# Patient Record
Sex: Male | Born: 1950 | Race: Black or African American | Hispanic: No | Marital: Single | Smoking: Current every day smoker
Health system: Southern US, Community
[De-identification: ages and names within clinical notes are randomized; demographics above are authoritative.]

## PROBLEM LIST (undated history)

## (undated) DIAGNOSIS — M1990 Unspecified osteoarthritis, unspecified site: Secondary | ICD-10-CM

## (undated) DIAGNOSIS — R413 Other amnesia: Secondary | ICD-10-CM

## (undated) DIAGNOSIS — F329 Major depressive disorder, single episode, unspecified: Secondary | ICD-10-CM

## (undated) DIAGNOSIS — E785 Hyperlipidemia, unspecified: Secondary | ICD-10-CM

## (undated) DIAGNOSIS — I10 Essential (primary) hypertension: Secondary | ICD-10-CM

## (undated) DIAGNOSIS — I639 Cerebral infarction, unspecified: Secondary | ICD-10-CM

## (undated) DIAGNOSIS — F32A Depression, unspecified: Secondary | ICD-10-CM

## (undated) HISTORY — DX: Unspecified osteoarthritis, unspecified site: M19.90

## (undated) HISTORY — DX: Depression, unspecified: F32.A

## (undated) HISTORY — DX: Hyperlipidemia, unspecified: E78.5

## (undated) HISTORY — DX: Essential (primary) hypertension: I10

## (undated) HISTORY — PX: COLONOSCOPY: SHX174

## (undated) HISTORY — DX: Cerebral infarction, unspecified: I63.9

## (undated) HISTORY — DX: Major depressive disorder, single episode, unspecified: F32.9

## (undated) HISTORY — DX: Other amnesia: R41.3

## (undated) HISTORY — PX: LUNG SURGERY: SHX703

---

## 1984-01-25 HISTORY — PX: LIVER SURGERY: SHX698

## 1997-10-26 ENCOUNTER — Emergency Department (HOSPITAL_COMMUNITY): Admission: EM | Admit: 1997-10-26 | Discharge: 1997-10-26 | Payer: Self-pay | Admitting: Emergency Medicine

## 2001-02-15 ENCOUNTER — Emergency Department (HOSPITAL_COMMUNITY): Admission: EM | Admit: 2001-02-15 | Discharge: 2001-02-15 | Payer: Self-pay | Admitting: Emergency Medicine

## 2001-02-15 ENCOUNTER — Encounter: Payer: Self-pay | Admitting: Emergency Medicine

## 2005-10-23 ENCOUNTER — Emergency Department (HOSPITAL_COMMUNITY): Admission: EM | Admit: 2005-10-23 | Discharge: 2005-10-23 | Payer: Self-pay | Admitting: Emergency Medicine

## 2005-12-22 ENCOUNTER — Ambulatory Visit: Payer: Self-pay | Admitting: *Deleted

## 2005-12-22 ENCOUNTER — Ambulatory Visit: Payer: Self-pay | Admitting: Family Medicine

## 2006-10-11 ENCOUNTER — Encounter (INDEPENDENT_AMBULATORY_CARE_PROVIDER_SITE_OTHER): Payer: Self-pay | Admitting: *Deleted

## 2007-04-04 ENCOUNTER — Inpatient Hospital Stay (HOSPITAL_COMMUNITY): Admission: AD | Admit: 2007-04-04 | Discharge: 2007-04-05 | Payer: Self-pay | Admitting: Urology

## 2007-05-28 ENCOUNTER — Ambulatory Visit: Payer: Self-pay | Admitting: Internal Medicine

## 2007-06-19 ENCOUNTER — Ambulatory Visit: Payer: Self-pay | Admitting: Internal Medicine

## 2007-09-21 ENCOUNTER — Ambulatory Visit: Payer: Self-pay | Admitting: Family Medicine

## 2007-09-21 LAB — CONVERTED CEMR LAB
ALT: 30 units/L (ref 0–53)
AST: 30 units/L (ref 0–37)
Albumin: 4 g/dL (ref 3.5–5.2)
Alkaline Phosphatase: 65 units/L (ref 39–117)
BUN: 20 mg/dL (ref 6–23)
Basophils Absolute: 0 10*3/uL (ref 0.0–0.1)
Basophils Relative: 0 % (ref 0–1)
CO2: 21 meq/L (ref 19–32)
Calcium: 9.1 mg/dL (ref 8.4–10.5)
Chloride: 105 meq/L (ref 96–112)
Cholesterol: 174 mg/dL (ref 0–200)
Creatinine, Ser: 1.26 mg/dL (ref 0.40–1.50)
Eosinophils Absolute: 0.2 10*3/uL (ref 0.0–0.7)
Eosinophils Relative: 3 % (ref 0–5)
Glucose, Bld: 87 mg/dL (ref 70–99)
HCT: 46.4 % (ref 39.0–52.0)
HDL: 55 mg/dL (ref >39)
Hemoglobin: 15.7 g/dL (ref 13.0–17.0)
LDL Cholesterol: 89 mg/dL (ref 0–99)
Lymphocytes Relative: 33 % (ref 12–46)
Lymphs Abs: 1.8 10*3/uL (ref 0.7–4.0)
MCHC: 33.8 g/dL (ref 30.0–36.0)
MCV: 90.6 fL (ref 78.0–100.0)
Monocytes Absolute: 0.7 10*3/uL (ref 0.1–1.0)
Monocytes Relative: 13 % — ABNORMAL HIGH (ref 3–12)
Neutro Abs: 2.8 10*3/uL (ref 1.7–7.7)
Neutrophils Relative %: 51 % (ref 43–77)
PSA: 0.98 ng/mL (ref 0.10–4.00)
Platelets: 237 10*3/uL (ref 150–400)
Potassium: 4.6 meq/L (ref 3.5–5.3)
RBC: 5.12 M/uL (ref 4.22–5.81)
RDW: 15.1 % (ref 11.5–15.5)
Sodium: 138 meq/L (ref 135–145)
Total Bilirubin: 0.8 mg/dL (ref 0.3–1.2)
Total CHOL/HDL Ratio: 3.2
Total Protein: 7.4 g/dL (ref 6.0–8.3)
Triglycerides: 151 mg/dL — ABNORMAL HIGH (ref <150)
VLDL: 30 mg/dL (ref 0–40)
WBC: 5.4 10*3/uL (ref 4.0–10.5)

## 2008-01-29 ENCOUNTER — Ambulatory Visit: Payer: Self-pay | Admitting: Family Medicine

## 2008-05-09 ENCOUNTER — Ambulatory Visit: Payer: Self-pay | Admitting: Family Medicine

## 2008-05-09 LAB — CONVERTED CEMR LAB
Chlamydia, Swab/Urine, PCR: NEGATIVE
GC Probe Amp, Urine: NEGATIVE

## 2008-05-12 ENCOUNTER — Ambulatory Visit (HOSPITAL_COMMUNITY): Admission: RE | Admit: 2008-05-12 | Discharge: 2008-05-12 | Payer: Self-pay | Admitting: Family Medicine

## 2008-07-29 ENCOUNTER — Ambulatory Visit: Payer: Self-pay | Admitting: Family Medicine

## 2009-01-24 DIAGNOSIS — I639 Cerebral infarction, unspecified: Secondary | ICD-10-CM

## 2009-01-24 HISTORY — DX: Cerebral infarction, unspecified: I63.9

## 2009-02-11 ENCOUNTER — Telehealth (INDEPENDENT_AMBULATORY_CARE_PROVIDER_SITE_OTHER): Payer: Self-pay | Admitting: *Deleted

## 2009-02-14 ENCOUNTER — Emergency Department (HOSPITAL_COMMUNITY): Admission: EM | Admit: 2009-02-14 | Discharge: 2009-02-14 | Payer: Self-pay | Admitting: Emergency Medicine

## 2009-02-25 ENCOUNTER — Ambulatory Visit: Payer: Self-pay | Admitting: Family Medicine

## 2009-02-25 LAB — CONVERTED CEMR LAB
ALT: 21 units/L (ref 0–53)
AST: 29 units/L (ref 0–37)
Albumin: 4 g/dL (ref 3.5–5.2)
Alkaline Phosphatase: 58 units/L (ref 39–117)
BUN: 9 mg/dL (ref 6–23)
CO2: 21 meq/L (ref 19–32)
Calcium: 8.8 mg/dL (ref 8.4–10.5)
Chloride: 107 meq/L (ref 96–112)
Cholesterol: 196 mg/dL (ref 0–200)
Creatinine, Ser: 0.98 mg/dL (ref 0.40–1.50)
Glucose, Bld: 96 mg/dL (ref 70–99)
HDL: 56 mg/dL (ref >39)
LDL Cholesterol: 128 mg/dL — ABNORMAL HIGH (ref 0–99)
Potassium: 4.4 meq/L (ref 3.5–5.3)
Sodium: 141 meq/L (ref 135–145)
TSH: 1.073 microintl units/mL (ref 0.350–4.500)
Total Bilirubin: 0.5 mg/dL (ref 0.3–1.2)
Total CHOL/HDL Ratio: 3.5
Total Protein: 7.2 g/dL (ref 6.0–8.3)
Triglycerides: 59 mg/dL (ref <150)
VLDL: 12 mg/dL (ref 0–40)

## 2009-02-27 ENCOUNTER — Ambulatory Visit: Payer: Self-pay | Admitting: Family Medicine

## 2009-07-31 ENCOUNTER — Ambulatory Visit: Payer: Self-pay | Admitting: Family Medicine

## 2009-10-02 ENCOUNTER — Ambulatory Visit: Payer: Self-pay | Admitting: Family Medicine

## 2010-01-01 ENCOUNTER — Ambulatory Visit: Payer: Self-pay | Admitting: Family Medicine

## 2010-02-23 NOTE — Progress Notes (Signed)
Summary: triage-slurred speech  Phone Note Call from Patient   Details for Reason: pt calls stating that he is having difficulty getting his words out. Able to speak in full sentences with nurse during assesment. States this has been going on for one day. States is intermittent. He denies any extremity weakness or alteration in gait. He denies headache or other neurological imparements such as facial drop or difficulty swallowing, or difficulty using fine motor skills. consumes 1-2 40 oz per week. States that he has consumed 1 40 oz since the onset of slurred speech. Patient is alert and oriented in NAD. Advised to keep appointment on 02/27/09 for HTN follow up. Scheduled for annual htn labs on 02/25/09 and advised if he NOS for labs his provider appointment will be cancelled. Recommend he decrease his alcohol intake and increase his water consumption. Educated on signs and symptoms of a stroke and advised him to call 911 if these are present, given his high risk behaviors and htn history.  Initial call taken by: Blondell Reveal RN,  February 11, 2009 3:45 PM

## 2010-04-12 LAB — URINALYSIS, ROUTINE W REFLEX MICROSCOPIC
Nitrite: NEGATIVE
Protein, ur: NEGATIVE mg/dL
Urobilinogen, UA: 1 mg/dL (ref 0.0–1.0)

## 2010-04-12 LAB — RAPID URINE DRUG SCREEN, HOSP PERFORMED
Amphetamines: NOT DETECTED
Benzodiazepines: NOT DETECTED
Tetrahydrocannabinol: NOT DETECTED

## 2010-04-12 LAB — COMPREHENSIVE METABOLIC PANEL
Alkaline Phosphatase: 51 U/L (ref 39–117)
BUN: 16 mg/dL (ref 6–23)
CO2: 22 mEq/L (ref 19–32)
Chloride: 107 mEq/L (ref 96–112)
GFR calc non Af Amer: >60 mL/min (ref >60)
Glucose, Bld: 99 mg/dL (ref 70–99)
Potassium: 4 mEq/L (ref 3.5–5.1)
Total Bilirubin: 1.1 mg/dL (ref 0.3–1.2)
Total Protein: 7.1 g/dL (ref 6.0–8.3)

## 2010-04-12 LAB — ETHANOL: Alcohol, Ethyl (B): <5 mg/dL (ref 0–10)

## 2010-04-12 LAB — URINE MICROSCOPIC-ADD ON

## 2010-05-31 IMAGING — CT CT HEAD W/O CM
1 series · 16 of 30 positions shown, 20 images · non-contrast
Comparison: None

CLINICAL DATA: Weakness

CT HEAD WITHOUT CONTRAST
TECHNIQUE: Contiguous axial images were obtained from the base of
the skull through the vertex without contrast.

[Series 2: headseq 4.8 h45s · axial · 0.43mm/px · z∈[-169,-41]mm · 16 of 30 slices shown, 20 images]
[im 2/30  brain]
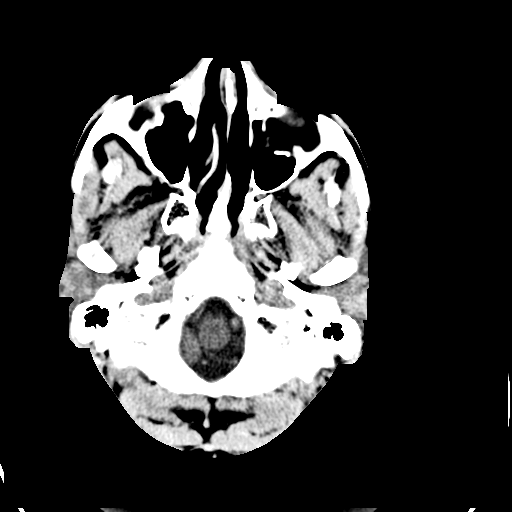
[im 2/30  bone]
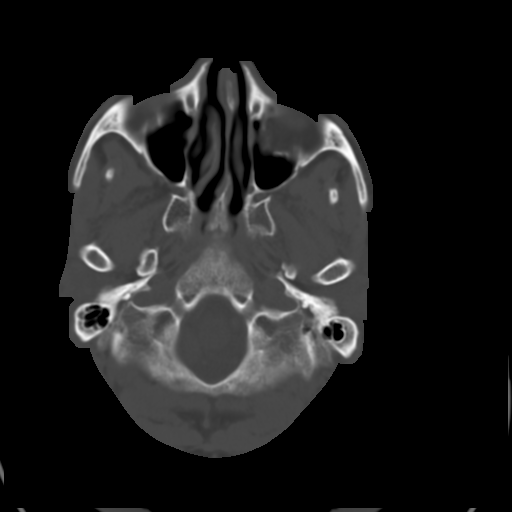
[im 4/30  brain]
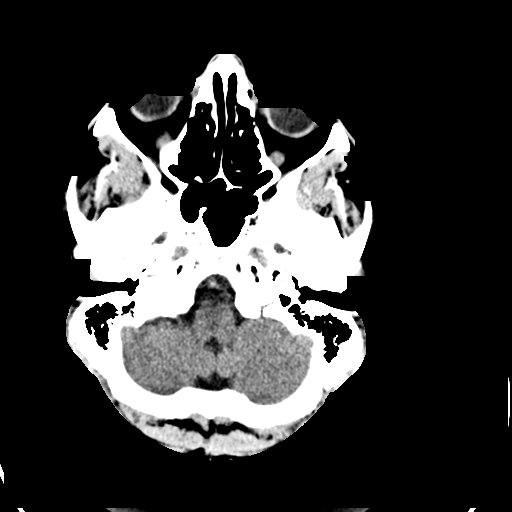
[im 6/30  brain]
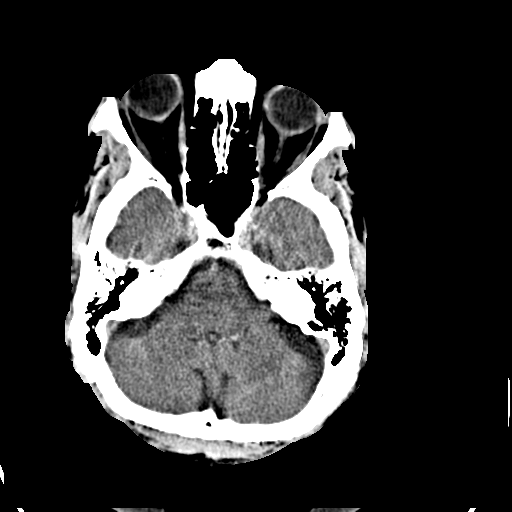
[im 8/30  brain]
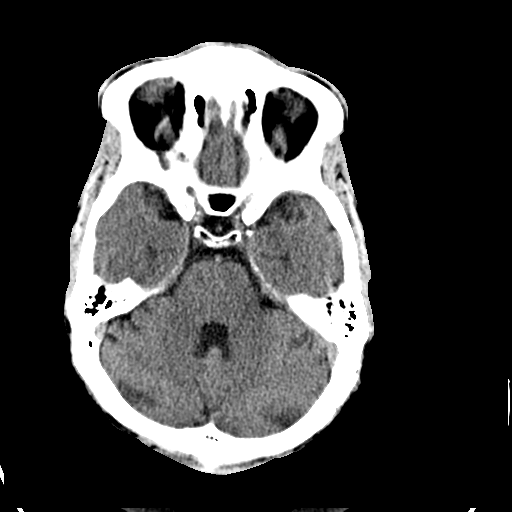
[im 9/30  brain]
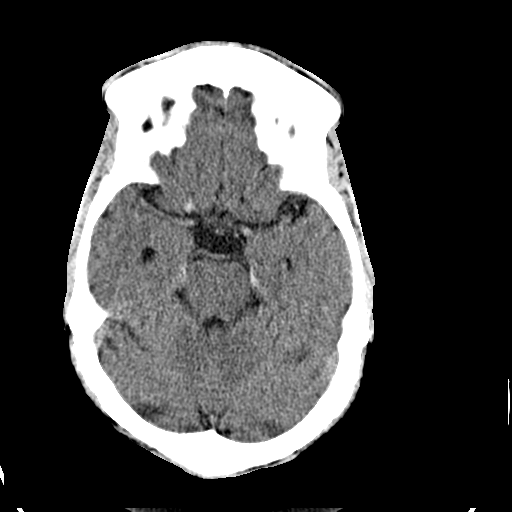
[im 9/30  bone]
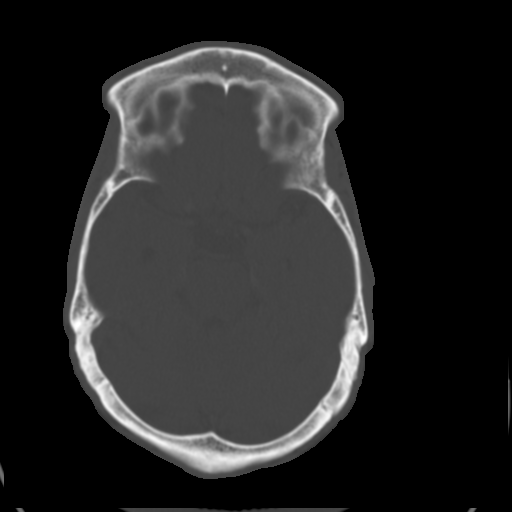
[im 11/30  brain]
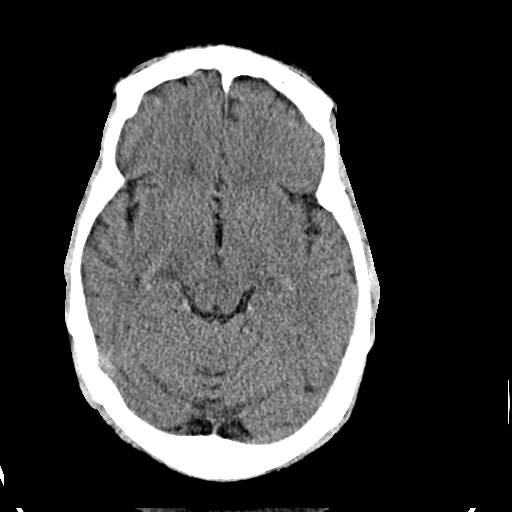
[im 13/30  brain]
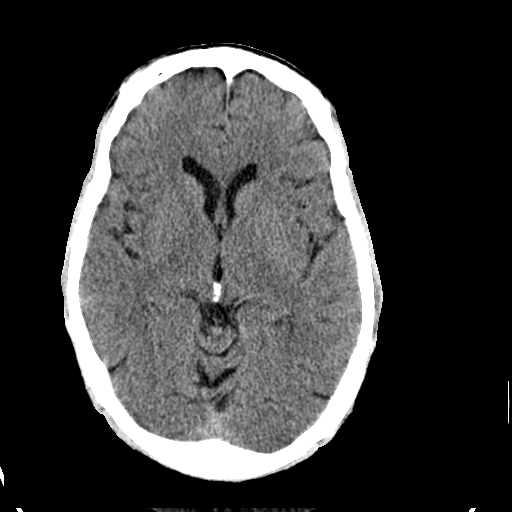
[im 15/30  brain]
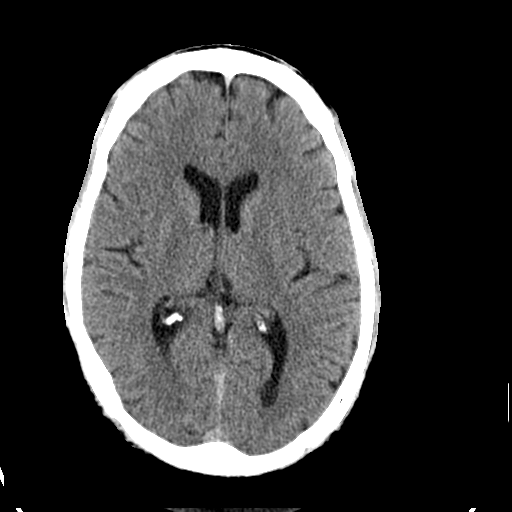
[im 16/30  brain]
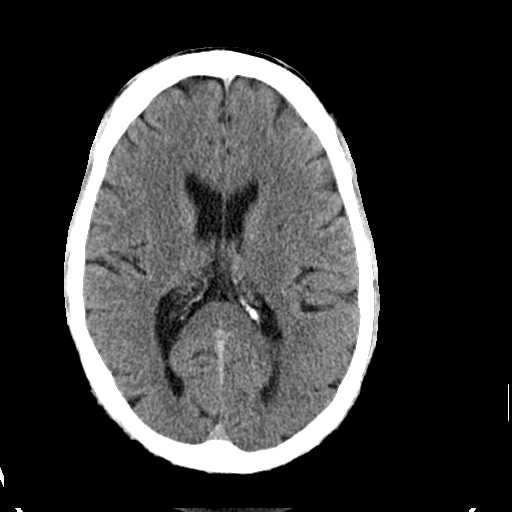
[im 16/30  bone]
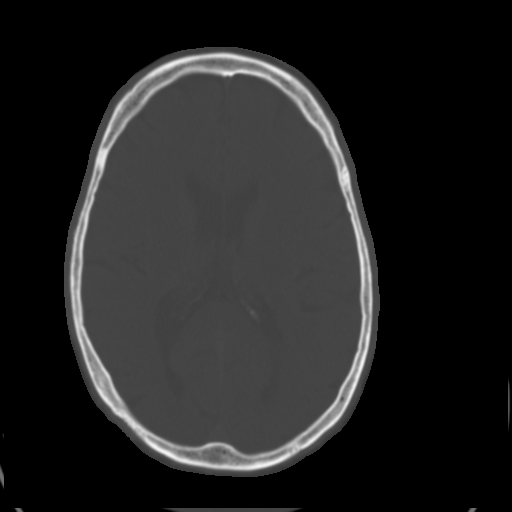
[im 18/30  brain]
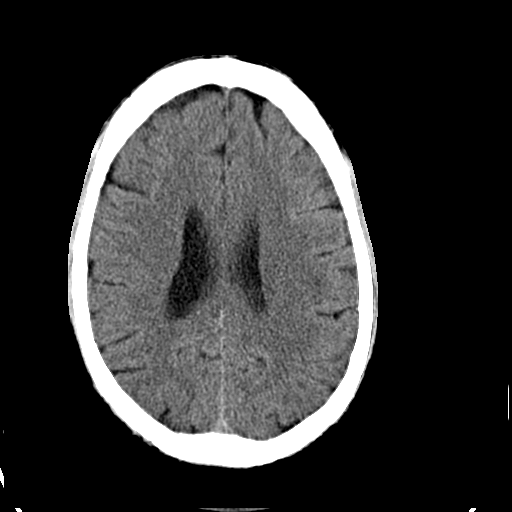
[im 20/30  brain]
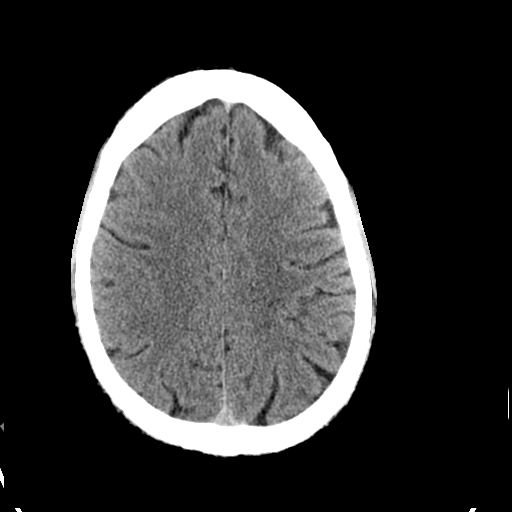
[im 22/30  brain]
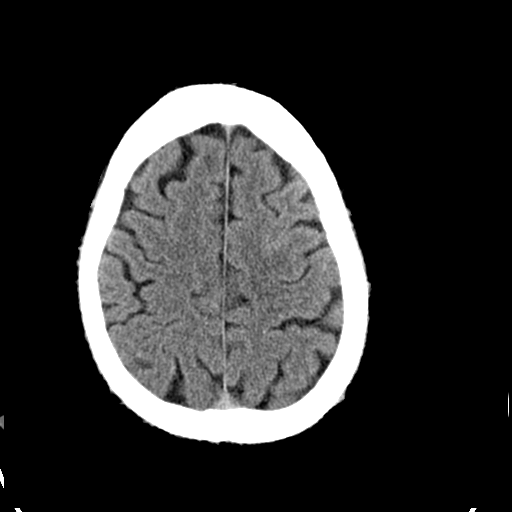
[im 23/30  brain]
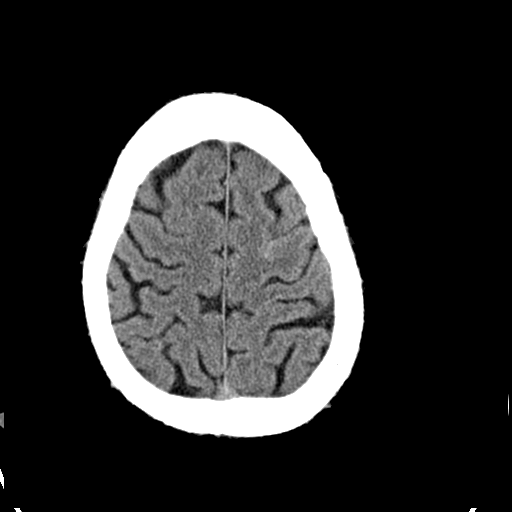
[im 23/30  bone]
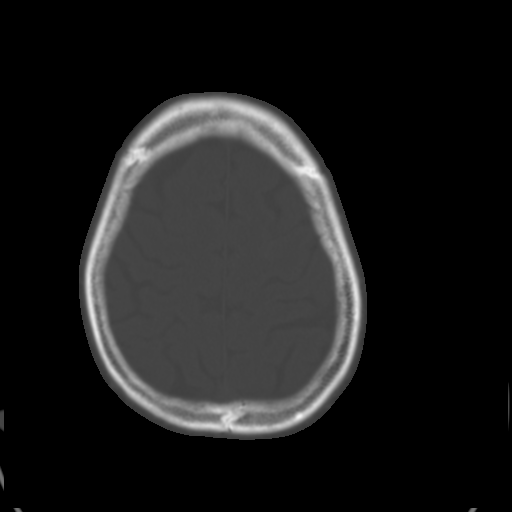
[im 25/30  brain]
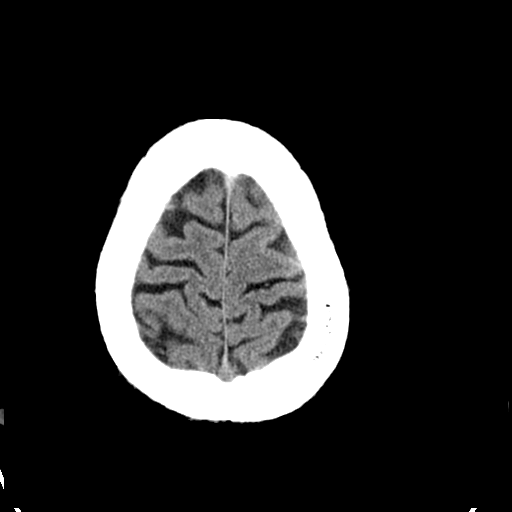
[im 27/30  brain]
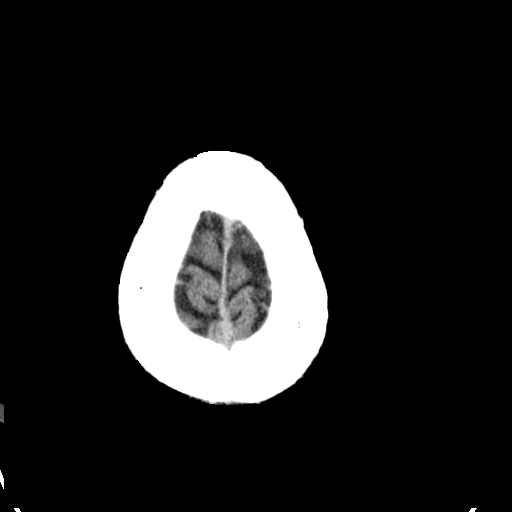
[im 29/30  brain]
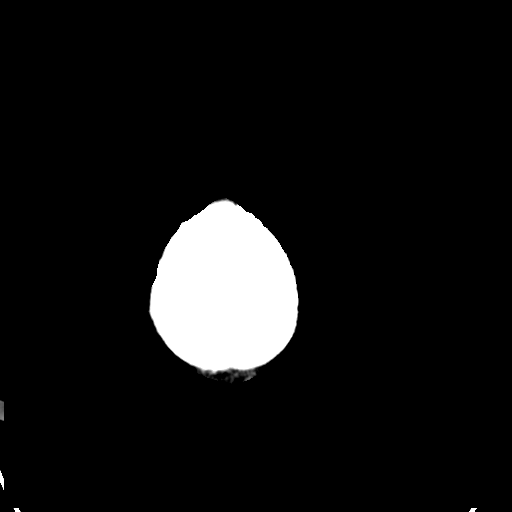

[16 of 30 positions shown; findings below may reference images not displayed]

FINDINGS: No depressed skull fracture is noted.  Paranasal sinuses
and mastoid air cells are unremarkable.  No intracranial
hemorrhage, mass effect or midline shift.  No intra or extra-axial
fluid collection.

No acute infarction.  No mass lesion is noted.

The gray and white matter differentiation is preserved.
IMPRESSION: No acute intracranial abnormality.

## 2010-09-15 ENCOUNTER — Emergency Department (HOSPITAL_COMMUNITY)
Admission: EM | Admit: 2010-09-15 | Discharge: 2010-09-15 | Disposition: A | Discharge status: Home / Self Care | Payer: PRIVATE HEALTH INSURANCE | Attending: Emergency Medicine | Admitting: Emergency Medicine

## 2010-09-15 DIAGNOSIS — L259 Unspecified contact dermatitis, unspecified cause: Secondary | ICD-10-CM | POA: Insufficient documentation

## 2010-09-15 DIAGNOSIS — M129 Arthropathy, unspecified: Secondary | ICD-10-CM | POA: Insufficient documentation

## 2010-09-15 DIAGNOSIS — N529 Male erectile dysfunction, unspecified: Secondary | ICD-10-CM | POA: Insufficient documentation

## 2010-09-15 DIAGNOSIS — I10 Essential (primary) hypertension: Secondary | ICD-10-CM | POA: Insufficient documentation

## 2011-01-13 DIAGNOSIS — F1011 Alcohol abuse, in remission: Secondary | ICD-10-CM | POA: Insufficient documentation

## 2011-01-31 DIAGNOSIS — F32A Depression, unspecified: Secondary | ICD-10-CM | POA: Insufficient documentation

## 2011-02-08 ENCOUNTER — Encounter: Payer: Self-pay | Admitting: Internal Medicine

## 2011-03-02 ENCOUNTER — Ambulatory Visit (AMBULATORY_SURGERY_CENTER): Payer: PRIVATE HEALTH INSURANCE | Admitting: *Deleted

## 2011-03-02 VITALS — Ht 66.0 in | Wt 151.9 lb

## 2011-03-02 DIAGNOSIS — Z1211 Encounter for screening for malignant neoplasm of colon: Secondary | ICD-10-CM

## 2011-03-02 MED ORDER — MOVIPREP 100 G PO SOLR: ORAL | Status: DC

## 2011-03-16 ENCOUNTER — Ambulatory Visit (AMBULATORY_SURGERY_CENTER): Payer: Medicaid Other | Admitting: Internal Medicine

## 2011-03-16 ENCOUNTER — Encounter: Payer: Self-pay | Admitting: Internal Medicine

## 2011-03-16 VITALS — BP 136/99 | HR 77 | Temp 98.1°F | Resp 12 | Ht 66.0 in | Wt 151.0 lb

## 2011-03-16 DIAGNOSIS — D126 Benign neoplasm of colon, unspecified: Secondary | ICD-10-CM

## 2011-03-16 DIAGNOSIS — Z86010 Personal history of colonic polyps: Secondary | ICD-10-CM | POA: Insufficient documentation

## 2011-03-16 DIAGNOSIS — Z1211 Encounter for screening for malignant neoplasm of colon: Secondary | ICD-10-CM

## 2011-03-16 DIAGNOSIS — K5730 Diverticulosis of large intestine without perforation or abscess without bleeding: Secondary | ICD-10-CM

## 2011-03-16 MED ORDER — NAPROXEN 250 MG PO TABS: 250.0000 mg | ORAL_TABLET | Freq: Two times a day (BID) | ORAL | Status: AC

## 2011-03-16 MED ORDER — SODIUM CHLORIDE 0.9 % IV SOLN: 500.0000 mL | INTRAVENOUS | Status: DC

## 2011-03-16 MED ORDER — ASPIRIN EC 81 MG PO TBEC: 81.0000 mg | DELAYED_RELEASE_TABLET | Freq: Every day | ORAL | Status: AC

## 2011-03-16 NOTE — Op Note (Signed)
[facility] [address] [place], [place]  [address]  COLONOSCOPY PROCEDURE REPORT  PATIENT:  Christian, Sanford  MR#:  [id number] BIRTHDATE:  1951-01-15, 60 yrs. old  GENDER:  male ENDOSCOPIST:  Iva Boop, MD, [facility] REF. BY:  Tracey Harries, M.D. PROCEDURE DATE:  03/16/2011 PROCEDURE:  Colonoscopy with snare polypectomy ASA CLASS:  Class II INDICATIONS:  Routine Risk Screening MEDICATIONS:   These medications were titrated to patient response per physician's verbal order, Fentanyl 75 mcg IV, Versed 5 mg IV, Benadryl 12.5 mg IV  DESCRIPTION OF PROCEDURE:   After the risks benefits and alternatives of the procedure were thoroughly explained, informed consent was obtained.  Digital rectal exam was performed and revealed no abnormalities.   The [id number] endoscope was introduced through the anus and advanced to the cecum, which was identified by both the appendix and ileocecal valve, without limitations.  The quality of the prep was good, using MoviPrep. The instrument was then slowly withdrawn as the colon was fully examined. <<PROCEDUREIMAGES>>  FINDINGS:  Three polyps were found. Ascending colon (1cm - snare cautery), descedning colon (5 mm cold snare), sigmoid (5 mm - cold snare). All removed sent to pathology.  Moderate diverticulosis was found in the left colon.  This was otherwise a normal examination of the colon.   Retroflexed views in the rectum revealed no abnormalities.    The time to cecum = 2:08 minutes. The scope was then withdrawn in 22:24 minutes from the cecum and the procedure completed. COMPLICATIONS:  None ENDOSCOPIC IMPRESSION: 1) Three polyps removed, largest 1 cm 2) Moderate diverticulosis in the left colon 3) Otherwise normal examination RECOMMENDATIONS: 1) Hold aspirin, aspirin products, and anti-inflammatory medication for 2 weeks. REPEAT EXAM:  In for Colonoscopy, pending biopsy results.  Iva Boop, MD, [facility]  CC:  Tracey Harries, MD and The Patient  n. eSIGNED:   Iva Boop at 03/16/2011 10:02 AM  Artemio Aly, [phone]

## 2011-03-16 NOTE — Progress Notes (Signed)
@   4540@ DISCHARGE Patient did not experience any of the following events: a burn prior to discharge; a fall within the facility; wrong site/side/patient/procedure/implant event; or a hospital transfer or hospital admission upon discharge from the facility. [phone]) Patient did not have preoperative order for IV antibiotic SSI prophylaxis. [phone])

## 2011-03-16 NOTE — Patient Instructions (Addendum)
Three polyps were removed today. They look benign. Iva Boop, MD, FACG   YOU HAD AN ENDOSCOPIC PROCEDURE TODAY AT THE [facility]: Refer to the procedure report that was given to you for any specific questions about what was found during the examination.  If the procedure report does not answer your questions, please call your gastroenterologist to clarify.  If you requested that your care partner not be given the details of your procedure findings, then the procedure report has been included in a sealed envelope for you to review at your convenience later.  YOU SHOULD EXPECT: Some feelings of bloating in the abdomen. Passage of more gas than usual.  Walking can help get rid of the air that was put into your GI tract during the procedure and reduce the bloating. If you had a lower endoscopy (such as a colonoscopy or flexible sigmoidoscopy) you may notice spotting of blood in your stool or on the toilet paper. If you underwent a bowel prep for your procedure, then you may not have a normal bowel movement for a few days.  DIET: Your first meal following the procedure should be a light meal and then it is ok to progress to your normal diet.  A half-sandwich or bowl of soup is an example of a good first meal.  Heavy or fried foods are harder to digest and may make you feel nauseous or bloated.  Likewise meals heavy in dairy and vegetables can cause extra gas to form and this can also increase the bloating.  Drink plenty of fluids but you should avoid alcoholic beverages for 24 hours.  ACTIVITY: Your care partner should take you home directly after the procedure.  You should plan to take it easy, moving slowly for the rest of the day.  You can resume normal activity the day after the procedure however you should NOT DRIVE or use heavy machinery for 24 hours (because of the sedation medicines used during the test).    SYMPTOMS TO REPORT IMMEDIATELY: A gastroenterologist can be reached at  any hour.  During normal business hours, 8:30 AM to 5:00 PM Monday through Friday, call [phone].  After hours and on weekends, please call the GI answering service at [phone] who will take a message and have the physician on call contact you.   Following lower endoscopy (colonoscopy or flexible sigmoidoscopy):  Excessive amounts of blood in the stool  Significant tenderness or worsening of abdominal pains  Swelling of the abdomen that is new, acute  Fever of 100F or higher  Following upper endoscopy (EGD)  Vomiting of blood or coffee ground material  New chest pain or pain under the shoulder blades  Painful or persistently difficult swallowing  New shortness of breath  Fever of 100F or higher  Black, tarry-looking stools  FOLLOW UP: If any biopsies were taken you will be contacted by phone or by letter within the next 1-3 weeks.  Call your gastroenterologist if you have not heard about the biopsies in 3 weeks.  Our staff will call the home number listed on your records the next business day following your procedure to check on you and address any questions or concerns that you may have at that time regarding the information given to you following your procedure. This is a courtesy call and so if there is no answer at the home number and we have not heard from you through the emergency physician on call, we will assume that you  have returned to your regular daily activities without incident.  SIGNATURES/CONFIDENTIALITY: You and/or your care partner have signed paperwork which will be entered into your electronic medical record.  These signatures attest to the fact that that the information above on your After Visit Summary has been reviewed and is understood.  Full responsibility of the confidentiality of this discharge information lies with you and/or your care-partner.    Colon Polyps A polyp is extra tissue that grows inside your body. Colon polyps grow in the large  intestine. The large intestine, also called the colon, is part of your digestive system. It is a long, hollow tube at the end of your digestive tract where your body makes and stores stool. Most polyps are not dangerous. They are benign. This means they are not cancerous. But over time, some types of polyps can turn into cancer. Polyps that are smaller than a pea are usually not harmful. But larger polyps could someday become or may already be cancerous. To be safe, doctors remove all polyps and test them.  WHO GETS POLYPS? Anyone can get polyps, but certain people are more likely than others. You may have a greater chance of getting polyps if:  You are over 50.   You have had polyps before.   Someone in your family has had polyps.   Someone in your family has had cancer of the large intestine.   Find out if someone in your family has had polyps. You may also be more likely to get polyps if you:   Eat a lot of fatty foods.   Smoke.   Drink alcohol.   Do not exercise.   Eat too much.  SYMPTOMS  Most small polyps do not cause symptoms. People often do not know they have one until their caregiver finds it during a regular checkup or while testing them for something else. Some people do have symptoms like these:  Bleeding from the anus. You might notice blood on your underwear or on toilet paper after you have had a bowel movement.   Constipation or diarrhea that lasts more than a week.   Blood in the stool. Blood can make stool look black or it can show up as red streaks in the stool.  If you have any of these symptoms, see your caregiver. HOW DOES THE DOCTOR TEST FOR POLYPS? The doctor can use four tests to check for polyps:  Digital rectal exam. The caregiver wears gloves and checks your rectum (the last part of the large intestine) to see if it feels normal. This test would find polyps only in the rectum. Your caregiver may need to do one of the other tests listed below to find  polyps higher up in the intestine.   Barium enema. The caregiver puts a liquid called barium into your rectum before taking x-rays of your large intestine. Barium makes your intestine look white in the pictures. Polyps are dark, so they are easy to see.   Sigmoidoscopy. With this test, the caregiver can see inside your large intestine. A thin flexible tube is placed into your rectum. The device is called a sigmoidoscope, which has a light and a tiny video camera in it. The caregiver uses the sigmoidoscope to look at the last third of your large intestine.   Colonoscopy. This test is like sigmoidoscopy, but the caregiver looks at all of the large intestine. It usually requires sedation. This is the most common method for finding and removing polyps.  TREATMENT  The caregiver will remove the polyp during sigmoidoscopy or colonoscopy. The polyp is then tested for cancer.   If you have had polyps, your caregiver may want you to get tested regularly in the future.  PREVENTION  There is not one sure way to prevent polyps. You might be able to lower your risk of getting them if you:  Eat more fruits and vegetables and less fatty food.   Do not smoke.   Avoid alcohol.   Exercise every day.   Lose weight if you are overweight.   Eating more calcium and folate can also lower your risk of getting polyps. Some foods that are rich in calcium are milk, cheese, and broccoli. Some foods that are rich in folate are chickpeas, kidney beans, and spinach.   Aspirin might help prevent polyps. Studies are under way.  Document Released: 10/07/2003 Document Revised: 09/22/2010 Document Reviewed: 03/14/2007 [facility] Patient Information 2012 [place], [place].  Diverticulosis Diverticulosis is a common condition that develops when small pouches (diverticula) form in the wall of the colon. The risk of diverticulosis increases with age. It happens more often in people who eat a low-fiber diet. Most individuals  with diverticulosis have no symptoms. Those individuals with symptoms usually experience abdominal pain, constipation, or loose stools (diarrhea). HOME CARE INSTRUCTIONS   Increase the amount of fiber in your diet as directed by your caregiver or dietician. This may reduce symptoms of diverticulosis.   Your caregiver may recommend taking a dietary fiber supplement.   Drink at least 6 to 8 glasses of water each day to prevent constipation.   Try not to strain when you have a bowel movement.   Your caregiver may recommend avoiding nuts and seeds to prevent complications, although this is still an uncertain benefit.   Only take over-the-counter or prescription medicines for pain, discomfort, or fever as directed by your caregiver.  FOODS WITH HIGH FIBER CONTENT INCLUDE:  Fruits. Apple, peach, pear, tangerine, raisins, prunes.   Vegetables. Brussels sprouts, asparagus, broccoli, cabbage, carrot, cauliflower, romaine lettuce, spinach, summer squash, tomato, winter squash, zucchini.   Starchy Vegetables. Baked beans, kidney beans, lima beans, split peas, lentils, potatoes (with skin).   Grains. Whole wheat bread, brown rice, bran flake cereal, plain oatmeal, white rice, shredded wheat, bran muffins.  SEEK IMMEDIATE MEDICAL CARE IF:   You develop increasing pain or severe bloating.   You have an oral temperature above 102 F (38.9 C), not controlled by medicine.   You develop vomiting or bowel movements that are bloody or black.  Document Released: 10/08/2003 Document Revised: 09/22/2010 Document Reviewed: 06/10/2009 [facility] Patient Information 2012 [place], [place].

## 2011-03-17 ENCOUNTER — Telehealth: Payer: Self-pay | Admitting: *Deleted

## 2011-03-17 NOTE — Telephone Encounter (Signed)
  Follow up Call-  Call back number 03/16/2011  Post procedure Call Back phone  # [phone]  Permission to leave phone message Yes     Patient questions:  Do you have a fever, pain , or abdominal swelling? no Pain Score  0 *  Have you tolerated food without any problems? yes  Have you been able to return to your normal activities? yes  Do you have any questions about your discharge instructions: Diet   no Medications  no Follow up visit  no  Do you have questions or concerns about your Care? no  Actions: * If pain score is 4 or above: No action needed, pain <4.

## 2011-03-21 ENCOUNTER — Encounter: Payer: Self-pay | Admitting: Internal Medicine

## 2011-03-21 NOTE — Progress Notes (Signed)
Quick Note:  3 adenomas with largest 1 cm Repeat colonoscopy 3 yrs - 02/2013 ______

## 2013-04-11 DIAGNOSIS — M549 Dorsalgia, unspecified: Secondary | ICD-10-CM | POA: Insufficient documentation

## 2013-04-11 DIAGNOSIS — R413 Other amnesia: Secondary | ICD-10-CM | POA: Insufficient documentation

## 2013-04-11 DIAGNOSIS — N529 Male erectile dysfunction, unspecified: Secondary | ICD-10-CM | POA: Insufficient documentation

## 2013-04-11 DIAGNOSIS — I10 Essential (primary) hypertension: Secondary | ICD-10-CM | POA: Insufficient documentation

## 2013-04-11 DIAGNOSIS — B1920 Unspecified viral hepatitis C without hepatic coma: Secondary | ICD-10-CM | POA: Insufficient documentation

## 2013-04-11 DIAGNOSIS — J45909 Unspecified asthma, uncomplicated: Secondary | ICD-10-CM | POA: Insufficient documentation

## 2013-04-11 DIAGNOSIS — E7800 Pure hypercholesterolemia, unspecified: Secondary | ICD-10-CM | POA: Insufficient documentation

## 2013-04-11 DIAGNOSIS — F17200 Nicotine dependence, unspecified, uncomplicated: Secondary | ICD-10-CM | POA: Insufficient documentation

## 2013-08-24 ENCOUNTER — Encounter: Payer: Self-pay | Admitting: *Deleted

## 2014-03-03 ENCOUNTER — Encounter: Payer: Self-pay | Admitting: Internal Medicine

## 2014-03-12 ENCOUNTER — Encounter: Payer: Self-pay | Admitting: Internal Medicine

## 2014-03-18 ENCOUNTER — Encounter: Payer: Self-pay | Admitting: Internal Medicine

## 2014-05-13 ENCOUNTER — Ambulatory Visit (AMBULATORY_SURGERY_CENTER): Payer: Self-pay

## 2014-05-13 VITALS — Ht 66.0 in | Wt 170.0 lb

## 2014-05-13 DIAGNOSIS — Z86010 Personal history of colonic polyps: Secondary | ICD-10-CM

## 2014-05-13 NOTE — Progress Notes (Signed)
Per pt, no allergies to soy or egg products.Pt not taking any weight loss meds or using  O2 at home. 

## 2014-05-27 ENCOUNTER — Encounter: Payer: Self-pay | Admitting: Internal Medicine

## 2014-05-27 ENCOUNTER — Ambulatory Visit (AMBULATORY_SURGERY_CENTER): Payer: Medicare Other | Admitting: Internal Medicine

## 2014-05-27 VITALS — BP 124/82 | HR 76 | Temp 98.2°F | Resp 12 | Ht 66.0 in | Wt 170.0 lb

## 2014-05-27 DIAGNOSIS — Z86010 Personal history of colonic polyps: Secondary | ICD-10-CM

## 2014-05-27 MED ORDER — SODIUM CHLORIDE 0.9 % IV SOLN: 500.0000 mL | INTRAVENOUS | Status: DC

## 2014-05-27 NOTE — Progress Notes (Signed)
A/ox3 pleased with MAC, report to Annette RN 

## 2014-05-27 NOTE — Patient Instructions (Addendum)
No polyps today! You still have a condition called diverticulosis - common and not usually a problem. Please read the handout provided.  Your next routine colonoscopy should be in 5 years - 2021.  I appreciate the opportunity to care for you. Gatha Mayer, MD, FACG      YOU HAD AN ENDOSCOPIC PROCEDURE TODAY AT [facility]:   Refer to the procedure report that was given to you for any specific questions about what was found during the examination.  If the procedure report does not answer your questions, please call your gastroenterologist to clarify.  If you requested that your care partner not be given the details of your procedure findings, then the procedure report has been included in a sealed envelope for you to review at your convenience later.  YOU SHOULD EXPECT: Some feelings of bloating in the abdomen. Passage of more gas than usual.  Walking can help get rid of the air that was put into your GI tract during the procedure and reduce the bloating. If you had a lower endoscopy (such as a colonoscopy or flexible sigmoidoscopy) you may notice spotting of blood in your stool or on the toilet paper. If you underwent a bowel prep for your procedure, you may not have a normal bowel movement for a few days.  Please Note:  You might notice some irritation and congestion in your nose or some drainage.  This is from the oxygen used during your procedure.  There is no need for concern and it should clear up in a day or so.  SYMPTOMS TO REPORT IMMEDIATELY:   Following lower endoscopy (colonoscopy or flexible sigmoidoscopy):  Excessive amounts of blood in the stool  Significant tenderness or worsening of abdominal pains  Swelling of the abdomen that is new, acute  Fever of 100F or higher   For urgent or emergent issues, a gastroenterologist can be reached at any hour by calling [phone].   DIET: Your first meal following the procedure should be a small meal  and then it is ok to progress to your normal diet. Heavy or fried foods are harder to digest and may make you feel nauseous or bloated.  Likewise, meals heavy in dairy and vegetables can increase bloating.  Drink plenty of fluids but you should avoid alcoholic beverages for 24 hours.  ACTIVITY:  You should plan to take it easy for the rest of today and you should NOT DRIVE or use heavy machinery until tomorrow (because of the sedation medicines used during the test).    FOLLOW UP: Our staff will call the number listed on your records the next business day following your procedure to check on you and address any questions or concerns that you may have regarding the information given to you following your procedure. If we do not reach you, we will leave a message.  However, if you are feeling well and you are not experiencing any problems, there is no need to return our call.  We will assume that you have returned to your regular daily activities without incident.  If any biopsies were taken you will be contacted by phone or by letter within the next 1-3 weeks.  Please call us at [phone] if you have not heard about the biopsies in 3 weeks.    SIGNATURES/CONFIDENTIALITY: You and/or your care partner have signed paperwork which will be entered into your electronic medical record.  These signatures attest to the fact that that the information  above on your After Visit Summary has been reviewed and is understood.  Full responsibility of the confidentiality of this discharge information lies with you and/or your care-partner.     Handout was given to your care partner on diverticulosis.  You may resume your current medications today. Please call if any questions or concerns.

## 2014-05-27 NOTE — Op Note (Signed)
[place]  [address] [place], [address]   COLONOSCOPY PROCEDURE REPORT  PATIENT: Christian, Sanford  MR#: [id number] BIRTHDATE: 05-17-1950 , 45  yrs. old GENDER: male ENDOSCOPIST: Gatha Mayer, MD, [facility] PROCEDURE DATE:  05/27/2014 PROCEDURE:   Colonoscopy, surveillance First Screening Colonoscopy - Avg.  risk and is 50 yrs.  old or older - No.  Prior Negative Screening - Now for repeat screening. N/A  History of Adenoma - Now for follow-up colonoscopy & has been > or = to 3 yrs.  Yes hx of adenoma.  Has been 3 or more years since last colonoscopy. ASA CLASS:   Class II INDICATIONS:Surveillance due to prior colonic neoplasia and PH Colon Adenoma. MEDICATIONS: Propofol 180 mg IV and Monitored anesthesia care  DESCRIPTION OF PROCEDURE:   After the risks benefits and alternatives of the procedure were thoroughly explained, informed consent was obtained.  The digital rectal exam revealed no abnormalities of the rectum, revealed no prostatic nodules, and revealed the prostate was not enlarged.   The LB [id number] endoscope was introduced through the anus and advanced to the cecum, which was identified by both the appendix and ileocecal valve. No adverse events experienced.   The quality of the prep was good.  (MiraLax was used)  The instrument was then slowly withdrawn as the colon was fully examined.      COLON FINDINGS: There was diverticulosis noted throughout the entire examined colon.   The examination was otherwise normal. Retroflexed views revealed no abnormalities. The time to cecum = 2.0 Withdrawal time = 11.4   The scope was withdrawn and the procedure completed. COMPLICATIONS: There were no immediate complications.  ENDOSCOPIC IMPRESSION: 1.   Diverticulosis was noted throughout the entire examined colon 2.   The examination was otherwise normal - good prep  RECOMMENDATIONS: Repeat Colonoscopy in 5 years. Hx 3 adenomas max 10 mm  2013  eSigned:  Gatha Mayer, MD, [facility] 05/27/2014 10:09 AM   cc: Eldridge Abrahams, NP and The Patient

## 2014-05-27 NOTE — Progress Notes (Signed)
No problems noted in the recovery room. maw 

## 2014-05-28 ENCOUNTER — Telehealth: Payer: Self-pay | Admitting: *Deleted

## 2014-05-28 NOTE — Telephone Encounter (Signed)
  Follow up Call-  Call back number 05/27/2014  Post procedure Call Back phone  # [phone]  Permission to leave phone message Yes     No answer and no answering machine

## 2014-10-14 ENCOUNTER — Emergency Department (HOSPITAL_COMMUNITY)
Admission: EM | Admit: 2014-10-14 | Discharge: 2014-10-14 | Disposition: A | Discharge status: Home / Self Care | Payer: Medicare HMO | Attending: Emergency Medicine | Admitting: Emergency Medicine

## 2014-10-14 ENCOUNTER — Encounter (HOSPITAL_COMMUNITY): Payer: Self-pay | Admitting: Emergency Medicine

## 2014-10-14 DIAGNOSIS — I10 Essential (primary) hypertension: Secondary | ICD-10-CM | POA: Insufficient documentation

## 2014-10-14 DIAGNOSIS — M19042 Primary osteoarthritis, left hand: Secondary | ICD-10-CM | POA: Diagnosis not present

## 2014-10-14 DIAGNOSIS — E785 Hyperlipidemia, unspecified: Secondary | ICD-10-CM | POA: Diagnosis not present

## 2014-10-14 DIAGNOSIS — F329 Major depressive disorder, single episode, unspecified: Secondary | ICD-10-CM | POA: Diagnosis not present

## 2014-10-14 DIAGNOSIS — L290 Pruritus ani: Secondary | ICD-10-CM

## 2014-10-14 DIAGNOSIS — Z8673 Personal history of transient ischemic attack (TIA), and cerebral infarction without residual deficits: Secondary | ICD-10-CM | POA: Insufficient documentation

## 2014-10-14 DIAGNOSIS — Z720 Tobacco use: Secondary | ICD-10-CM | POA: Insufficient documentation

## 2014-10-14 DIAGNOSIS — K649 Unspecified hemorrhoids: Secondary | ICD-10-CM | POA: Diagnosis not present

## 2014-10-14 DIAGNOSIS — Z79899 Other long term (current) drug therapy: Secondary | ICD-10-CM | POA: Diagnosis not present

## 2014-10-14 DIAGNOSIS — Z7982 Long term (current) use of aspirin: Secondary | ICD-10-CM | POA: Insufficient documentation

## 2014-10-14 DIAGNOSIS — K6289 Other specified diseases of anus and rectum: Secondary | ICD-10-CM | POA: Diagnosis present

## 2014-10-14 MED ORDER — HYDROCORTISONE ACETATE 25 MG RE SUPP: 25.0000 mg | Freq: Two times a day (BID) | RECTAL | Status: AC

## 2014-10-14 NOTE — ED Notes (Signed)
2 week hx of rectal itching. Denies blood in stools. Used Aspercreme for intermittent treatment with minimal relief of itching
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
## 2014-10-14 NOTE — Discharge Instructions (Signed)
Use anusol as directed. Avoid becoming constipating by eating plenty of fiber and drinking lots of water. Perform warm sitz baths 4 times daily to help with rectal itching/hemorrhoids. Follow up with your regular doctor in 1 week for recheck of symptoms. Return to the ER for changes or worsening symptoms.   Hemorrhoids Hemorrhoids are puffy (swollen) veins around the rectum or anus. Hemorrhoids can cause pain, itching, bleeding, or irritation. HOME CARE 1. Eat foods with fiber, such as whole grains, beans, nuts, fruits, and vegetables. Ask your doctor about taking products with added fiber in them (fibersupplements). 2. Drink enough fluid to keep your pee (urine) clear or pale yellow. 3. Exercise often. 4. Go to the bathroom when you have the urge to poop. Do not wait. 5. Avoid straining to poop (bowel movement). 6. Keep the butt area dry and clean. Use wet toilet paper or moist paper towels. 7. Medicated creams and medicine inserted into the anus (anal suppository) may be used or applied as told. 8. Only take medicine as told by your doctor. 9. Take a warm water bath (sitz bath) for 15-20 minutes to ease pain. Do this 3-4 times a day. 10. Place ice packs on the area if it is tender or puffy. Use the ice packs between the warm water baths. 1. Put ice in a plastic bag. 2. Place a towel between your skin and the bag. 3. Leave the ice on for 15-20 minutes, 03-04 times a day. 11. Do not use a donut-shaped pillow or sit on the toilet for a long time. GET HELP RIGHT AWAY IF:   You have more pain that is not controlled by treatment or medicine.  You have bleeding that will not stop.  You have trouble or are unable to poop (bowel movement).  You have pain or puffiness outside the area of the hemorrhoids. MAKE SURE YOU:   Understand these instructions.  Will watch your condition.  Will get help right away if you are not doing well or get worse. Document Released: 10/20/2007 Document  Revised: 12/28/2011 Document Reviewed: 11/22/2011 [facility] Patient Information 2015 [place], [place]. This information is not intended to replace advice given to you by your health care provider. Make sure you discuss any questions you have with your health care provider.  Anal Pruritus Anal pruritus is when the area around the opening of the butt (anus) itches. This itching often happens when the area becomes moist. Moisture may be caused by sweating or a small amount of poop (stool) left on the area. Scratching can cause more skin damage. HOME CARE 12. Keep clean by washing your body well. 13. Clean the affected area with wet toilet paper, baby wipes, or a wet washcloth. Do this after you poop and at bedtime. Avoid using soaps on this area. Dry the area well. Pat the area dry. 14. Do not scrub the area with anything, even toilet paper. 15. Do not scratch the itchy area. Scratching makes the itching worse. 16. Take a warm water bath (sitz bath). Do this for 15 to 20 minutes, 2 to 3 times a day. Pat the area dry. 17. Use zinc oxide cream or a cream that keeps moisture away on the area. 18. Only take medicines as told by your doctor. 37. Talk to your doctor about fiber pills (supplements). 20. Wear cotton underwear and loose clothing. 21. Do not use bubble bath, scented toilet paper, or genital deodorants. GET HELP RIGHT AWAY IF:  Your itching does not get better or  it gets worse.  You have a fever.  You have more pain, puffiness (swelling), or redness. MAKE SURE YOU:   Understand these instructions.  Will watch your condition.  Will get help right away if you are not doing well or get worse. Document Released: 09/22/2010 Document Revised: 04/04/2011 Document Reviewed: 09/22/2010 [facility] Patient Information 2015 [place], [place]. This information is not intended to replace advice given to you by your health care provider. Make sure you discuss any questions you have with your health care  provider.  Disposable Sitz Bath A disposable sitz bath is a plastic basin that fits over the toilet. A bag is hung above the toilet and is connected to a tube that opens into the disposable sitz bath. The bag is filled with warm water that can flow into the basin through the tube.  HOW TO USE A DISPOSABLE SITZ BATH 22. Close the clamp on the tubing before filling the bag with water. This is to prevent leakage. 23. Fill the sitz bath basin and the plastic bag with warm water. 24. Place the filled basin on the toilet with the seat raised. Make sure the overflow opening is facing toward the back of the toilet. Central the filled plastic bag overhead on a hook or towel rack close to the toilet. When the bag is unclamped, a steady stream of water will flow from the bag, through the tubing, and into the basin. 26. Attach the tubing to the opening on the basin. 27. Sit on the basin positioned on the toilet seat and release the clamp. This will allow warm water to flush the area around your genitals and anus (perineum). 28. Remain sitting on the basin for approximately 15 to 20 minutes. 29. Stand up and pat the perineum area dry. If needed, apply clean bandages (dressings) to the affected area. 30. Tip the basin into the toilet to remove any remaining water and flush the toilet. 31. Wash the basin with warm water and soap. Let it dry in the sink. 32. Store the basin and tubing in a clean, dry area. 39. Wash your hands with soap and water. SEEK MEDICAL CARE IF: You get worse instead of better. Stop the sitz baths if you get worse. MAKE SURE YOU:  Understand these instructions.  Will watch your condition.  Will get help right away if you are not doing well or get worse. Document Released: 07/12/2011 Document Revised: 10/05/2011 Document Reviewed: 07/12/2011 [facility] Patient Information 2015 [place], [place]. This information is not intended to replace advice given to you by your health care  provider. Make sure you discuss any questions you have with your health care provider.

## 2017-06-13 ENCOUNTER — Emergency Department (HOSPITAL_COMMUNITY): Payer: Medicare HMO

## 2017-06-13 ENCOUNTER — Encounter (HOSPITAL_COMMUNITY): Payer: Self-pay | Admitting: *Deleted

## 2017-06-13 ENCOUNTER — Emergency Department (HOSPITAL_COMMUNITY)
Admission: EM | Admit: 2017-06-13 | Discharge: 2017-06-13 | Disposition: A | Discharge status: Home / Self Care | Payer: Medicare HMO | Attending: Emergency Medicine | Admitting: Emergency Medicine

## 2017-06-13 DIAGNOSIS — M25511 Pain in right shoulder: Secondary | ICD-10-CM

## 2017-06-13 DIAGNOSIS — F17210 Nicotine dependence, cigarettes, uncomplicated: Secondary | ICD-10-CM | POA: Diagnosis not present

## 2017-06-13 DIAGNOSIS — I10 Essential (primary) hypertension: Secondary | ICD-10-CM | POA: Diagnosis not present

## 2017-06-13 DIAGNOSIS — Z7982 Long term (current) use of aspirin: Secondary | ICD-10-CM | POA: Insufficient documentation

## 2017-06-13 DIAGNOSIS — Z79899 Other long term (current) drug therapy: Secondary | ICD-10-CM | POA: Insufficient documentation

## 2017-06-13 LAB — CBC
HCT: 49 % (ref 39.0–52.0)
Hemoglobin: 16.2 g/dL (ref 13.0–17.0)
MCH: 30.1 pg (ref 26.0–34.0)
MCHC: 33.1 g/dL (ref 30.0–36.0)
MCV: 91.1 fL (ref 78.0–100.0)
Platelets: 248 10*3/uL (ref 150–400)
RBC: 5.38 MIL/uL (ref 4.22–5.81)
RDW: 14.3 % (ref 11.5–15.5)
WBC: 4.9 10*3/uL (ref 4.0–10.5)

## 2017-06-13 LAB — BASIC METABOLIC PANEL
Anion gap: 10 (ref 5–15)
BUN: 18 mg/dL (ref 6–20)
CALCIUM: 9 mg/dL (ref 8.9–10.3)
CO2: 20 mmol/L — AB (ref 22–32)
Chloride: 109 mmol/L (ref 101–111)
Creatinine, Ser: 1.27 mg/dL — ABNORMAL HIGH (ref 0.61–1.24)
GFR calc Af Amer: >60 mL/min (ref >60)
GFR, EST NON AFRICAN AMERICAN: 58 mL/min — AB (ref >60)
GLUCOSE: 104 mg/dL — AB (ref 65–99)
Potassium: 4.1 mmol/L (ref 3.5–5.1)
Sodium: 139 mmol/L (ref 135–145)

## 2017-06-13 LAB — I-STAT TROPONIN, ED: TROPONIN I, POC: 0 ng/mL (ref 0.00–0.08)

## 2017-06-13 MED ORDER — TRAMADOL HCL 50 MG PO TABS: 50.0000 mg | ORAL_TABLET | Freq: Four times a day (QID) | ORAL | 0 refills | Status: DC | PRN

## 2017-06-13 MED ORDER — KETOROLAC TROMETHAMINE 15 MG/ML IJ SOLN
15.0000 mg | Freq: Once | INTRAMUSCULAR | Status: AC
  Administered 2017-06-13: 15 mg via INTRAMUSCULAR
  Filled 2017-06-13: qty 1

## 2017-06-13 MED ORDER — TRAMADOL HCL 50 MG PO TABS: 50.0000 mg | ORAL_TABLET | Freq: Two times a day (BID) | ORAL | 0 refills | Status: AC | PRN
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
## 2017-06-13 NOTE — Discharge Instructions (Addendum)
Please read attached information. If you experience any new or worsening signs or symptoms please return to the emergency room for evaluation. Please follow-up with your primary care provider or specialist as discussed. Please use medication prescribed only as directed and discontinue taking if you have any concerning signs or symptoms.   °

## 2017-06-13 NOTE — ED Triage Notes (Addendum)
To ED for eval of right should and arm pain and axillary pain. Started a few days ago. States the pain was severe when it started - worse with movement or touch. Has decreased with 'creams and stuff'. Stiff feeling this pain but now 'a nagging pain'. No injury noted. Pulses palpable. No sob. No cp. No abd pain. No nausea or vomiting

## 2018-09-27 IMAGING — DX DG SHOULDER 2+V*R*
3 series · 4 of 4 positions shown · non-contrast
Comparison: None.

CLINICAL DATA: 66-year-old male with a history right shoulder pain

EXAM:
RIGHT SHOULDER - 2+ VIEW

[shoulder grashey]
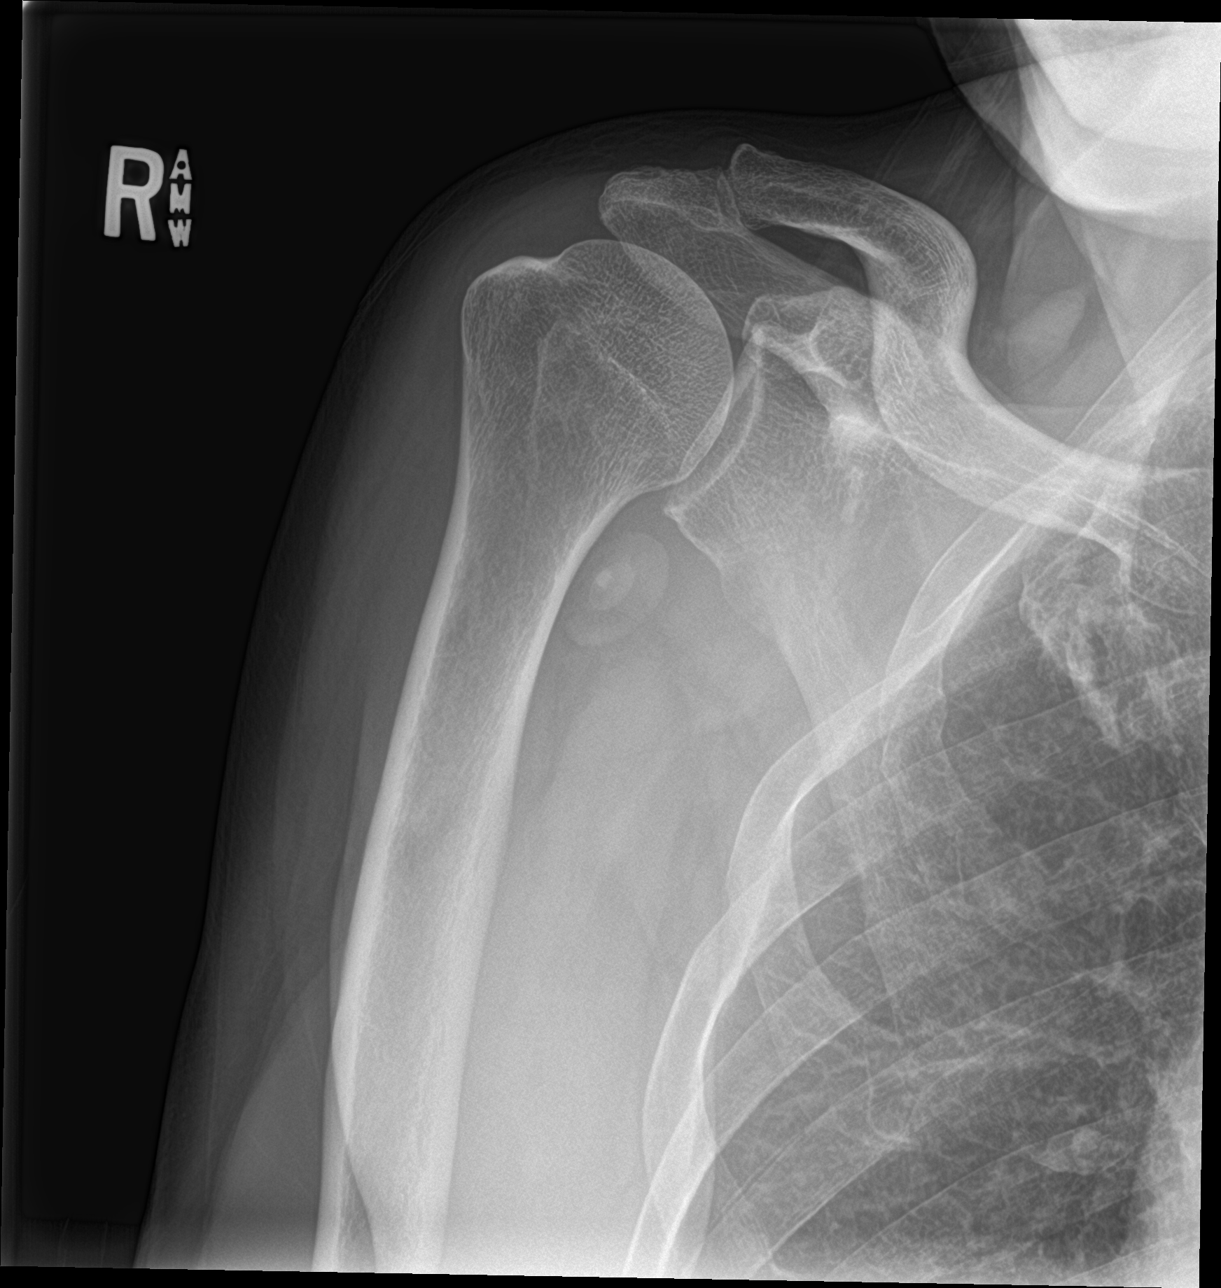

[Series 2: shoulder y view · 0.14mm/px · 2 of 2 slices shown]
[im 1/2]
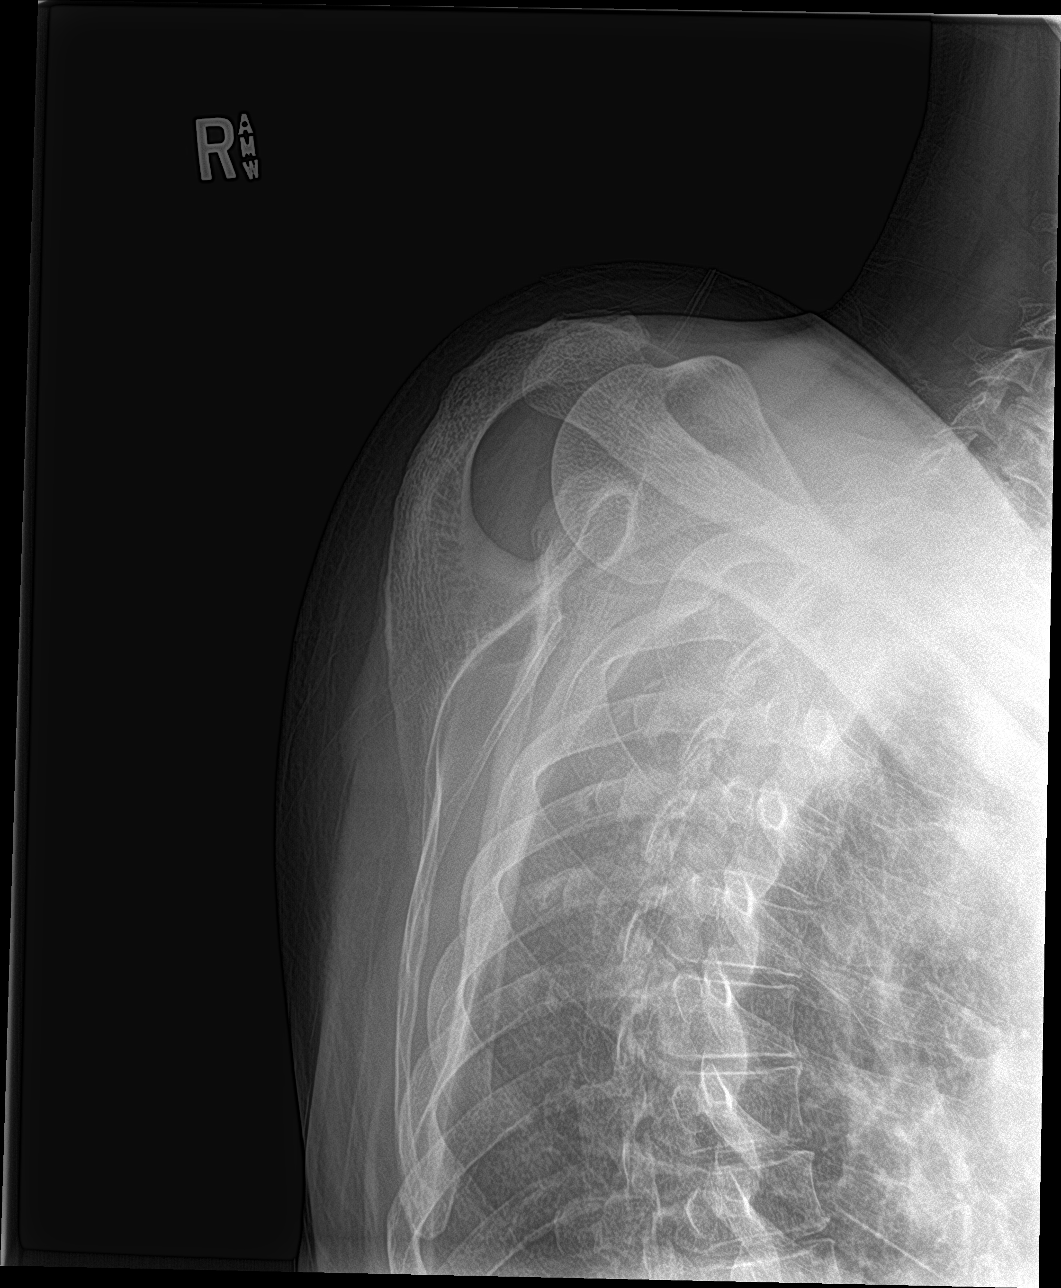
[im 2/2]
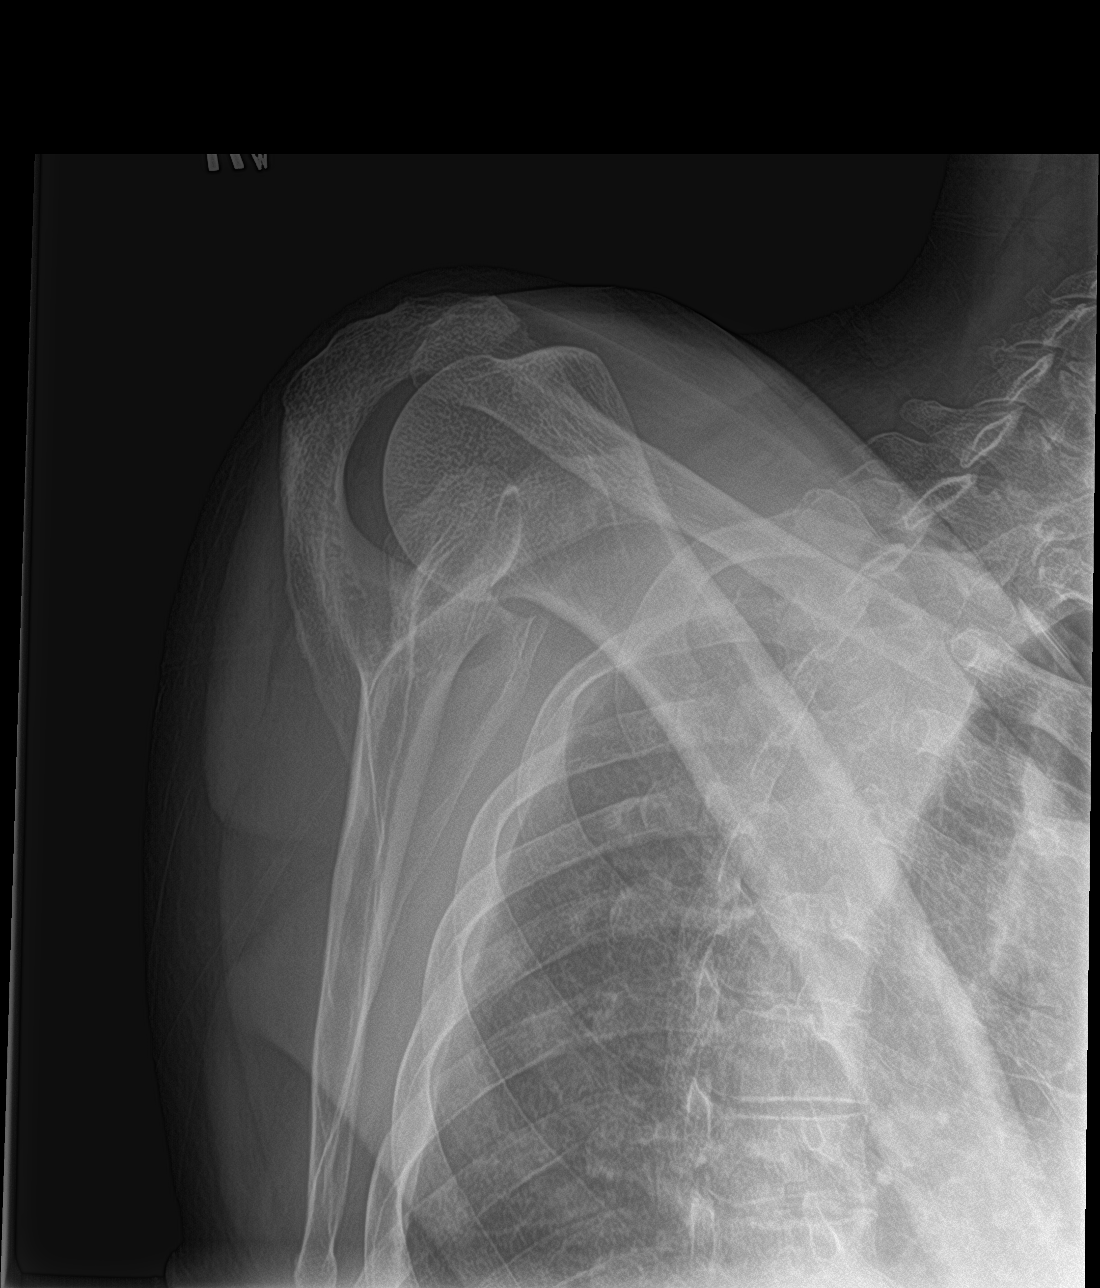

[shoulder axillary]
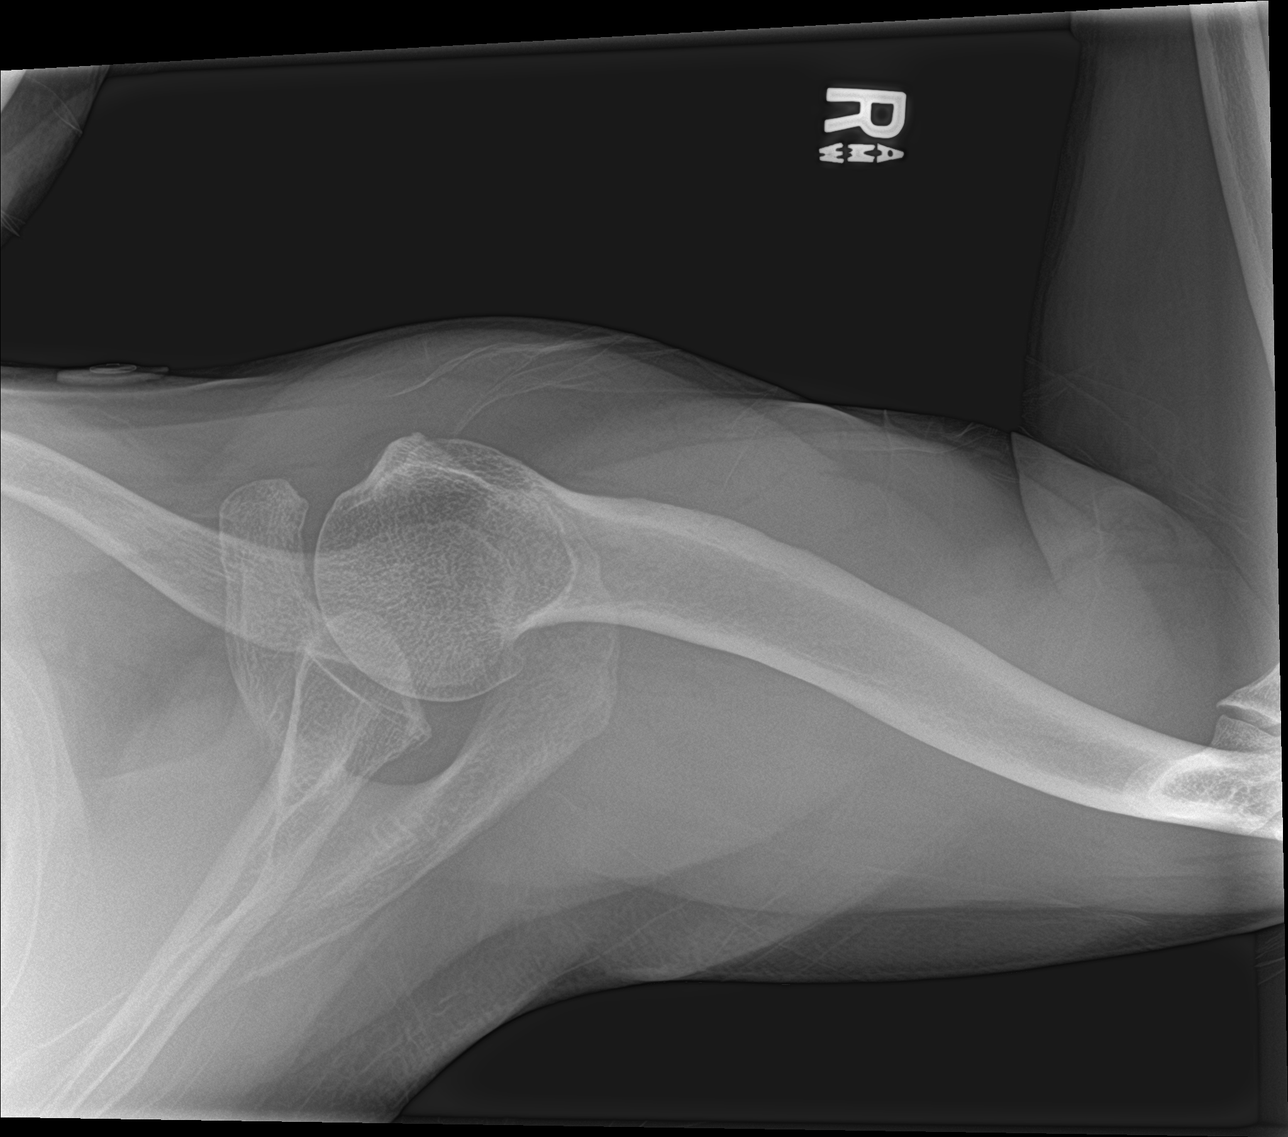

[4 of 4 positions shown; findings below may reference images not displayed]

FINDINGS: There is no evidence of fracture or dislocation. Degenerative
changes of the acromioclavicular joint. Soft tissues are
unremarkable.
IMPRESSION: Negative for acute bony abnormality

## 2019-06-13 ENCOUNTER — Encounter: Payer: Self-pay | Admitting: Internal Medicine

## 2019-07-17 ENCOUNTER — Other Ambulatory Visit: Payer: Self-pay

## 2019-07-17 ENCOUNTER — Ambulatory Visit (AMBULATORY_SURGERY_CENTER): Payer: Self-pay | Admitting: *Deleted

## 2019-07-17 VITALS — Ht 66.0 in | Wt 157.6 lb

## 2019-07-17 DIAGNOSIS — Z86010 Personal history of colonic polyps: Secondary | ICD-10-CM

## 2019-07-17 NOTE — Progress Notes (Signed)
2nd dose of covid vaccine 03-23-19  Pt is aware that care partner will wait in the car during procedure; if they feel like they will be too hot or cold to wait in the car; they may wait in the 4 th floor lobby. Patient is aware to bring only one care partner. We want them to wear a mask (we do not have any that we can provide them), practice social distancing, and we will check their temperatures when they get here.  I did remind the patient that their care partner needs to stay in the parking lot the entire time and have a cell phone available, we will call them when the pt is ready for discharge. Patient will wear mask into building.    No trouble with anesthesia, difficulty with intubation or hx/fam hx of malignant hyperthermia per pt   No egg or soy allergy  No home oxygen use   No medications for weight loss taken  Pt denies constipation issues  Reviewed prep instructions with pt several times

## 2019-07-31 ENCOUNTER — Encounter: Payer: Self-pay | Admitting: Internal Medicine

## 2019-07-31 ENCOUNTER — Other Ambulatory Visit: Payer: Self-pay

## 2019-07-31 ENCOUNTER — Ambulatory Visit (AMBULATORY_SURGERY_CENTER): Payer: Medicare HMO | Admitting: Internal Medicine

## 2019-07-31 VITALS — BP 119/70 | HR 67 | Temp 97.5°F | Resp 12 | Ht 66.0 in | Wt 157.0 lb

## 2019-07-31 DIAGNOSIS — D124 Benign neoplasm of descending colon: Secondary | ICD-10-CM

## 2019-07-31 DIAGNOSIS — Z86010 Personal history of colonic polyps: Secondary | ICD-10-CM | POA: Diagnosis not present

## 2019-07-31 MED ORDER — SODIUM CHLORIDE 0.9 % IV SOLN: 500.0000 mL | Freq: Once | INTRAVENOUS | Status: DC

## 2019-07-31 NOTE — Progress Notes (Signed)
Report given to PACU, vss 

## 2019-07-31 NOTE — Progress Notes (Signed)
Pt's states no medical or surgical changes since previsit or office visit.  CW - vitals 

## 2019-07-31 NOTE — Patient Instructions (Addendum)
I found and removed 3 tiny polyps.  You also have a condition called diverticulosis - common and not usually a problem. Please read the handout provided.  I will let you know pathology results and when to have another routine colonoscopy by mail and/or My Chart.  I appreciate the opportunity to care for you. Gatha Mayer, MD, [facility]   Continue present medications and diet.  YOU HAD AN ENDOSCOPIC PROCEDURE TODAY AT [facility]:   Refer to the procedure report that was given to you for any specific questions about what was found during the examination.  If the procedure report does not answer your questions, please call your gastroenterologist to clarify.  If you requested that your care partner not be given the details of your procedure findings, then the procedure report has been included in a sealed envelope for you to review at your convenience later.  YOU SHOULD EXPECT: Some feelings of bloating in the abdomen. Passage of more gas than usual.  Walking can help get rid of the air that was put into your GI tract during the procedure and reduce the bloating. If you had a lower endoscopy (such as a colonoscopy or flexible sigmoidoscopy) you may notice spotting of blood in your stool or on the toilet paper. If you underwent a bowel prep for your procedure, you may not have a normal bowel movement for a few days.  Please Note:  You might notice some irritation and congestion in your nose or some drainage.  This is from the oxygen used during your procedure.  There is no need for concern and it should clear up in a day or so.  SYMPTOMS TO REPORT IMMEDIATELY:   Following lower endoscopy (colonoscopy or flexible sigmoidoscopy):  Excessive amounts of blood in the stool  Significant tenderness or worsening of abdominal pains  Swelling of the abdomen that is new, acute  Fever of 100F or higher   For urgent or emergent issues, a gastroenterologist can be reached at any hour by  calling [phone]. Do not use MyChart messaging for urgent concerns.    DIET:  We do recommend a small meal at first, but then you may proceed to your regular diet.  Drink plenty of fluids but you should avoid alcoholic beverages for 24 hours.  ACTIVITY:  You should plan to take it easy for the rest of today and you should NOT DRIVE or use heavy machinery until tomorrow (because of the sedation medicines used during the test).    FOLLOW UP: Our staff will call the number listed on your records 48-72 hours following your procedure to check on you and address any questions or concerns that you may have regarding the information given to you following your procedure. If we do not reach you, we will leave a message.  We will attempt to reach you two times.  During this call, we will ask if you have developed any symptoms of COVID 19. If you develop any symptoms (ie: fever, flu-like symptoms, shortness of breath, cough etc.) before then, please call [phone].  If you test positive for Covid 19 in the 2 weeks post procedure, please call and report this information to [place].    If any biopsies were taken you will be contacted by phone or by letter within the next 1-3 weeks.  Please call us at [phone] if you have not heard about the biopsies in 3 weeks.    SIGNATURES/CONFIDENTIALITY: You and/or your care partner  have signed paperwork which will be entered into your electronic medical record.  These signatures attest to the fact that that the information above on your After Visit Summary has been reviewed and is understood.  Full responsibility of the confidentiality of this discharge information lies with you and/or your care-partner.

## 2019-07-31 NOTE — Op Note (Signed)
[facility] Patient Name: Christian Sanford Procedure Date: 07/31/2019 11:41 AM MRN: [id number] Endoscopist: Gatha Mayer , MD Age: 69 Referring MD:  Date of Birth: 10/12/1950 Gender: Male Account #: [id number] Procedure:                Colonoscopy Indications:              Surveillance: Personal history of adenomatous                            polyps on last colonoscopy 5 years ago Medicines:                Propofol per Anesthesia, Monitored Anesthesia Care Procedure:                Pre-Anesthesia Assessment:                           - Prior to the procedure, a History and Physical                            was performed, and patient medications and                            allergies were reviewed. The patient's tolerance of                            previous anesthesia was also reviewed. The risks                            and benefits of the procedure and the sedation                            options and risks were discussed with the patient.                            All questions were answered, and informed consent                            was obtained. Prior Anticoagulants: The patient has                            taken no previous anticoagulant or antiplatelet                            agents. ASA Grade Assessment: III - A patient with                            severe systemic disease. After reviewing the risks                            and benefits, the patient was deemed in                            satisfactory condition to undergo the procedure.  After obtaining informed consent, the colonoscope                            was passed under direct vision. Throughout the                            procedure, the patient's blood pressure, pulse, and                            oxygen saturations were monitored continuously. The                            Colonoscope was introduced through the anus and                             advanced to the the cecum, identified by                            appendiceal orifice and ileocecal valve. The                            colonoscopy was performed without difficulty. The                            patient tolerated the procedure well. The quality                            of the bowel preparation was good. The ileocecal                            valve, appendiceal orifice, and rectum were                            photographed. Scope In: 11:49:28 AM Scope Out: 12:07:04 PM Scope Withdrawal Time: 0 hours 15 minutes 14 seconds  Total Procedure Duration: 0 hours 17 minutes 36 seconds  Findings:                 The perianal and digital rectal examinations were                            normal. Pertinent negatives include normal prostate                            (size, shape, and consistency).                           Three sessile polyps were found in the descending                            colon. The polyps were diminutive in size. These                            polyps were removed with a cold snare. Resection  and retrieval were complete. Verification of                            patient identification for the specimen was done.                            Estimated blood loss was minimal.                           Multiple diverticula were found in the sigmoid                            colon, descending colon and transverse colon.                           The exam was otherwise without abnormality on                            direct and retroflexion views. Complications:            No immediate complications. Estimated Blood Loss:     Estimated blood loss was minimal. Impression:               - Three diminutive polyps in the descending colon,                            removed with a cold snare. Resected and retrieved.                           - Diverticulosis in the sigmoid colon, in the                            descending  colon and in the transverse colon.                           - The examination was otherwise normal on direct                            and retroflexion views.                           - Personal history of colonic polyps. adenomas 2013                            max 10 mm none 2016 Recommendation:           - Patient has a contact number available for                            emergencies. The signs and symptoms of potential                            delayed complications were discussed with the                            patient.  Return to normal activities tomorrow.                            Written discharge instructions were provided to the                            patient.                           - Resume previous diet.                           - Continue present medications.                           - Await pathology results.                           - Repeat colonoscopy is recommended for                            surveillance. The colonoscopy date will be                            determined after pathology results from today's                            exam become available for review. Gatha Mayer, MD 07/31/2019 12:12:51 PM This report has been signed electronically.

## 2019-07-31 NOTE — Progress Notes (Signed)
Called to room to assist during endoscopic procedure.  Patient ID and intended procedure confirmed with present staff. Received instructions for my participation in the procedure from the performing physician.  

## 2019-08-02 ENCOUNTER — Telehealth: Payer: Self-pay

## 2019-08-02 NOTE — Telephone Encounter (Signed)
No answer, left message to call back later today, B.Yaritsa Savarino RN. 

## 2019-08-02 NOTE — Telephone Encounter (Signed)
  Follow up Call-  Call back number 07/31/2019  Post procedure Call Back phone  # [phone]  Permission to leave phone message Yes  Some recent data might be hidden     Patient questions:  Do you have a fever, pain , or abdominal swelling? No. Pain Score  0 *  Have you tolerated food without any problems? Yes.    Have you been able to return to your normal activities? Yes.    Do you have any questions about your discharge instructions: Diet   No. Medications  No. Follow up visit  No.  Do you have questions or concerns about your Care? No.  Actions: * If pain score is 4 or above: No action needed, pain <4.  1. Have you developed a fever since your procedure? No  2.   Have you had an respiratory symptoms (SOB or cough) since your procedure? No  3.   Have you tested positive for COVID 19 since your procedure No  4.   Have you had any family members/close contacts diagnosed with the COVID 19 since your procedure?  No   If yes to any of these questions please route to Joylene John, RN and Erenest Rasher, RN

## 2019-08-05 ENCOUNTER — Encounter: Payer: Self-pay | Admitting: Internal Medicine
